# Patient Record
Sex: Female | Born: 1990 | Race: Black or African American | Hispanic: No | Marital: Single
Health system: Southern US, Community
[De-identification: ages and names within clinical notes are randomized; demographics above are authoritative.]

---

## 2020-11-19 ENCOUNTER — Emergency Department (HOSPITAL_COMMUNITY): Payer: BC Managed Care – PPO

## 2020-11-19 ENCOUNTER — Emergency Department (HOSPITAL_BASED_OUTPATIENT_CLINIC_OR_DEPARTMENT_OTHER): Payer: BC Managed Care – PPO

## 2020-11-19 ENCOUNTER — Other Ambulatory Visit: Payer: Self-pay

## 2020-11-19 ENCOUNTER — Emergency Department (HOSPITAL_COMMUNITY)
Admission: EM | Admit: 2020-11-19 | Discharge: 2020-11-19 | Disposition: A | Discharge status: Home / Self Care | Payer: BC Managed Care – PPO | Attending: Emergency Medicine | Admitting: Emergency Medicine

## 2020-11-19 DIAGNOSIS — Y990 Civilian activity done for income or pay: Secondary | ICD-10-CM | POA: Insufficient documentation

## 2020-11-19 DIAGNOSIS — M7989 Other specified soft tissue disorders: Secondary | ICD-10-CM

## 2020-11-19 DIAGNOSIS — S82841A Displaced bimalleolar fracture of right lower leg, initial encounter for closed fracture: Secondary | ICD-10-CM | POA: Diagnosis not present

## 2020-11-19 DIAGNOSIS — M79604 Pain in right leg: Secondary | ICD-10-CM

## 2020-11-19 DIAGNOSIS — S99911A Unspecified injury of right ankle, initial encounter: Secondary | ICD-10-CM | POA: Diagnosis present

## 2020-11-19 DIAGNOSIS — W1839XA Other fall on same level, initial encounter: Secondary | ICD-10-CM | POA: Diagnosis not present

## 2020-11-19 LAB — POC URINE PREG, ED: Preg Test, Ur: NEGATIVE

## 2020-11-19 MED ORDER — FENTANYL CITRATE (PF) 100 MCG/2ML IJ SOLN
50.0000 ug | Freq: Once | INTRAMUSCULAR | Status: AC
  Administered 2020-11-19: 50 ug via INTRAMUSCULAR
  Filled 2020-11-19: qty 2

## 2020-11-19 MED ORDER — OXYCODONE-ACETAMINOPHEN 5-325 MG PO TABS
1.0000 | ORAL_TABLET | Freq: Once | ORAL | Status: AC
  Administered 2020-11-19: 1 via ORAL
  Filled 2020-11-19: qty 1

## 2020-11-19 MED ORDER — OXYCODONE-ACETAMINOPHEN 5-325 MG PO TABS: 1.0000 | ORAL_TABLET | Freq: Four times a day (QID) | ORAL | 0 refills | Status: AC | PRN

## 2020-11-19 MED ORDER — IBUPROFEN 800 MG PO TABS: 800.0000 mg | ORAL_TABLET | Freq: Three times a day (TID) | ORAL | 0 refills | Status: AC
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
## 2020-11-19 NOTE — ED Notes (Signed)
Pt given a cup of water to drink per PA for urine pregnancy test.
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
## 2020-11-19 NOTE — ED Notes (Signed)
Pt transported to Vascular for ultrasound.  ?

## 2020-11-19 NOTE — Progress Notes (Signed)
Orthopedic Tech Progress Note Patient Details:  Becky Mckenzie [birth year] [id number]   Ortho Devices Type of Ortho Device: Stirrup splint, Post (short) splint, Crutches Splint Material: Plaster Ortho Device/Splint Location: RLE Ortho Device/Splint Interventions: Ordered, Application, Adjustment   Post Interventions Patient Tolerated: Fair Instructions Provided: Care of device, Poper ambulation with device  Docia Furl 11/19/2020, 5:06 PM

## 2020-11-19 NOTE — Discharge Instructions (Signed)
Please call Dr. Donnie Mesa office tomorrow regarding need for follow up of your ankle fracture  Do not get splint wet until you can see Dr. De Nurse.  You also cannot bare any weight onto the ankle.   Pick up medication and take as prescribed. I would recommend taking the Ibuprofen first and if you have break through pain to then take the percocet.   While at home please rest, ice, and elevate your leg to help reduce pain/swelling

## 2020-11-19 NOTE — Progress Notes (Signed)
RLE venous duplex has been completed.  Preliminary results given to [place], Charity fundraiser.  Results can be found under chart review under CV PROC. 11/19/2020 1:25 PM Jennae Hakeem RVT, RDMS

## 2022-08-25 IMAGING — DX DG ANKLE COMPLETE 3+V*R*
3 series · 3 of 3 positions shown · non-contrast
Comparison: None.

CLINICAL DATA: Right ankle pain

EXAM:
RIGHT ANKLE - COMPLETE 3+ VIEW

[ankle ap]
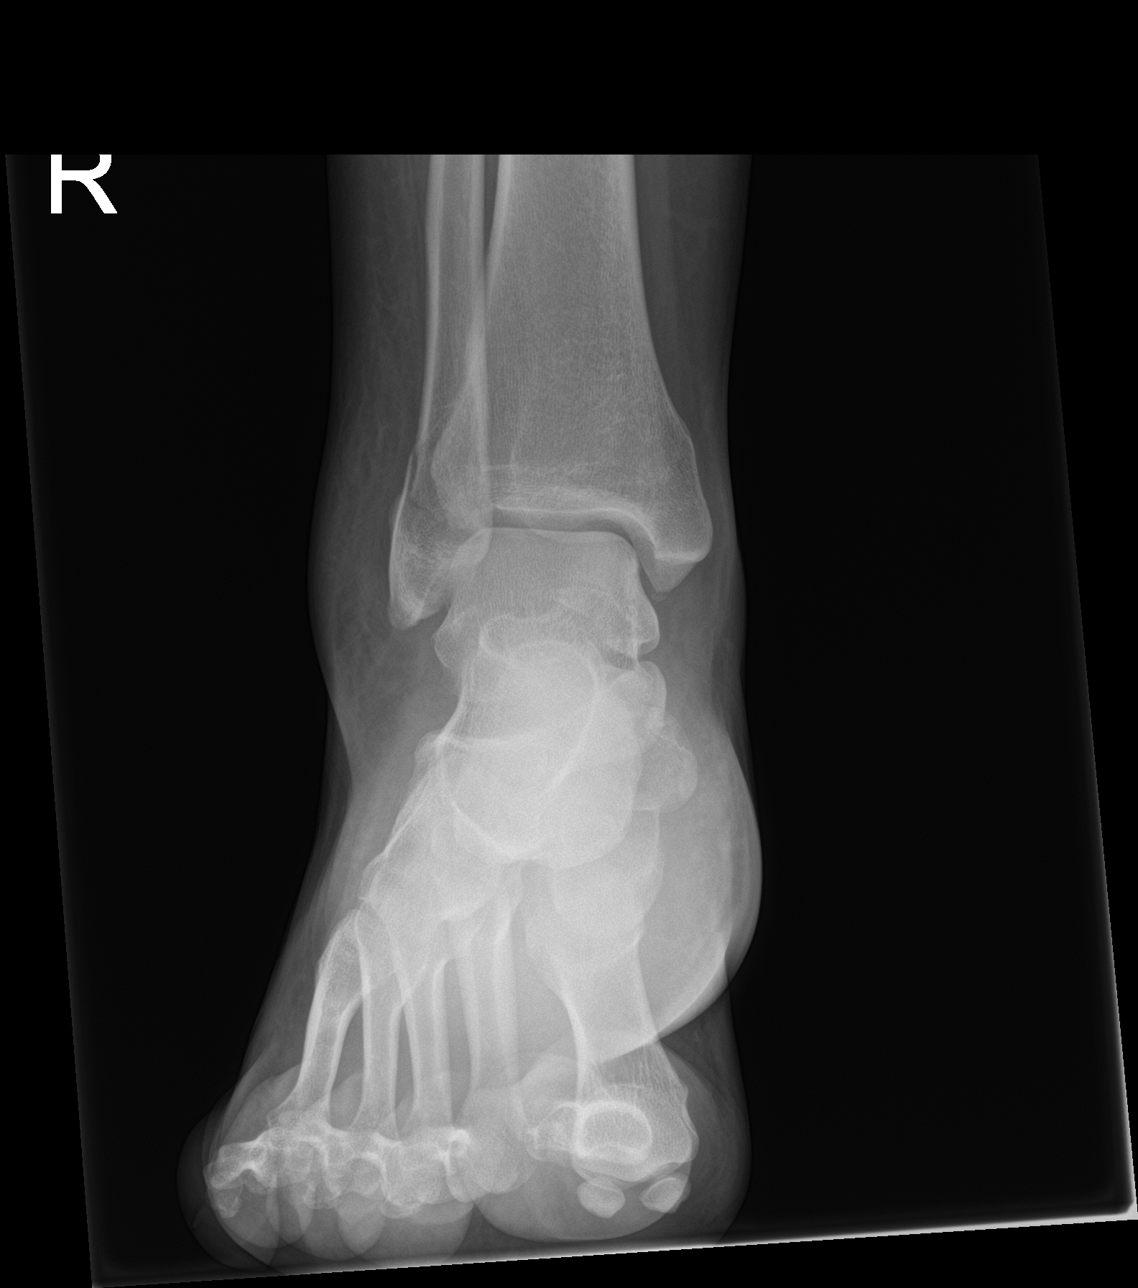

[ankle obl]
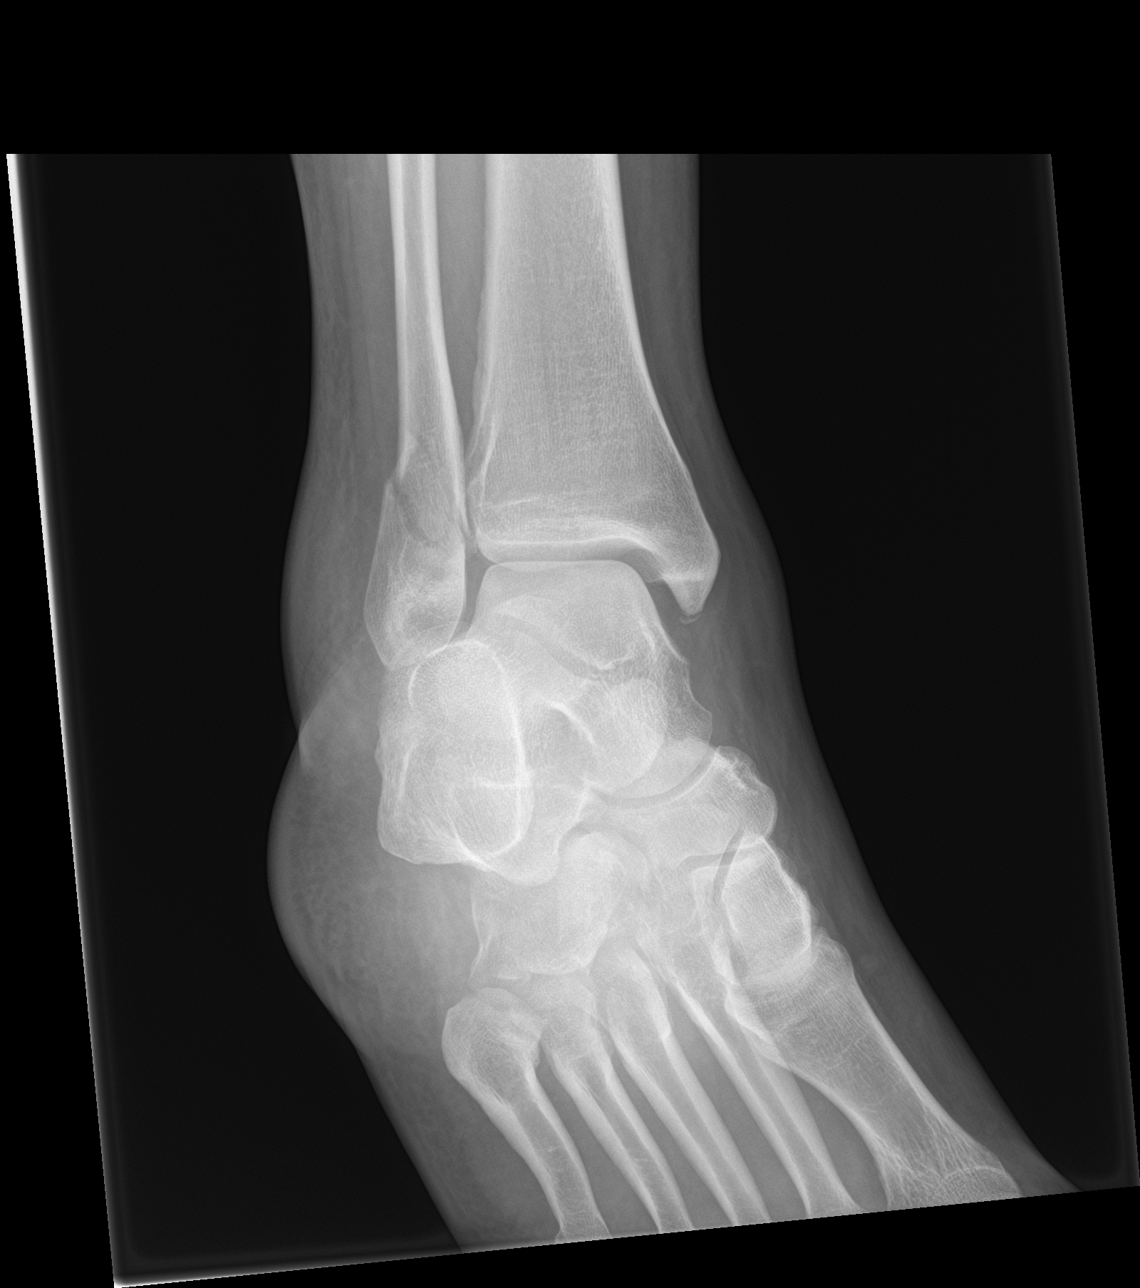

[ankle lat]
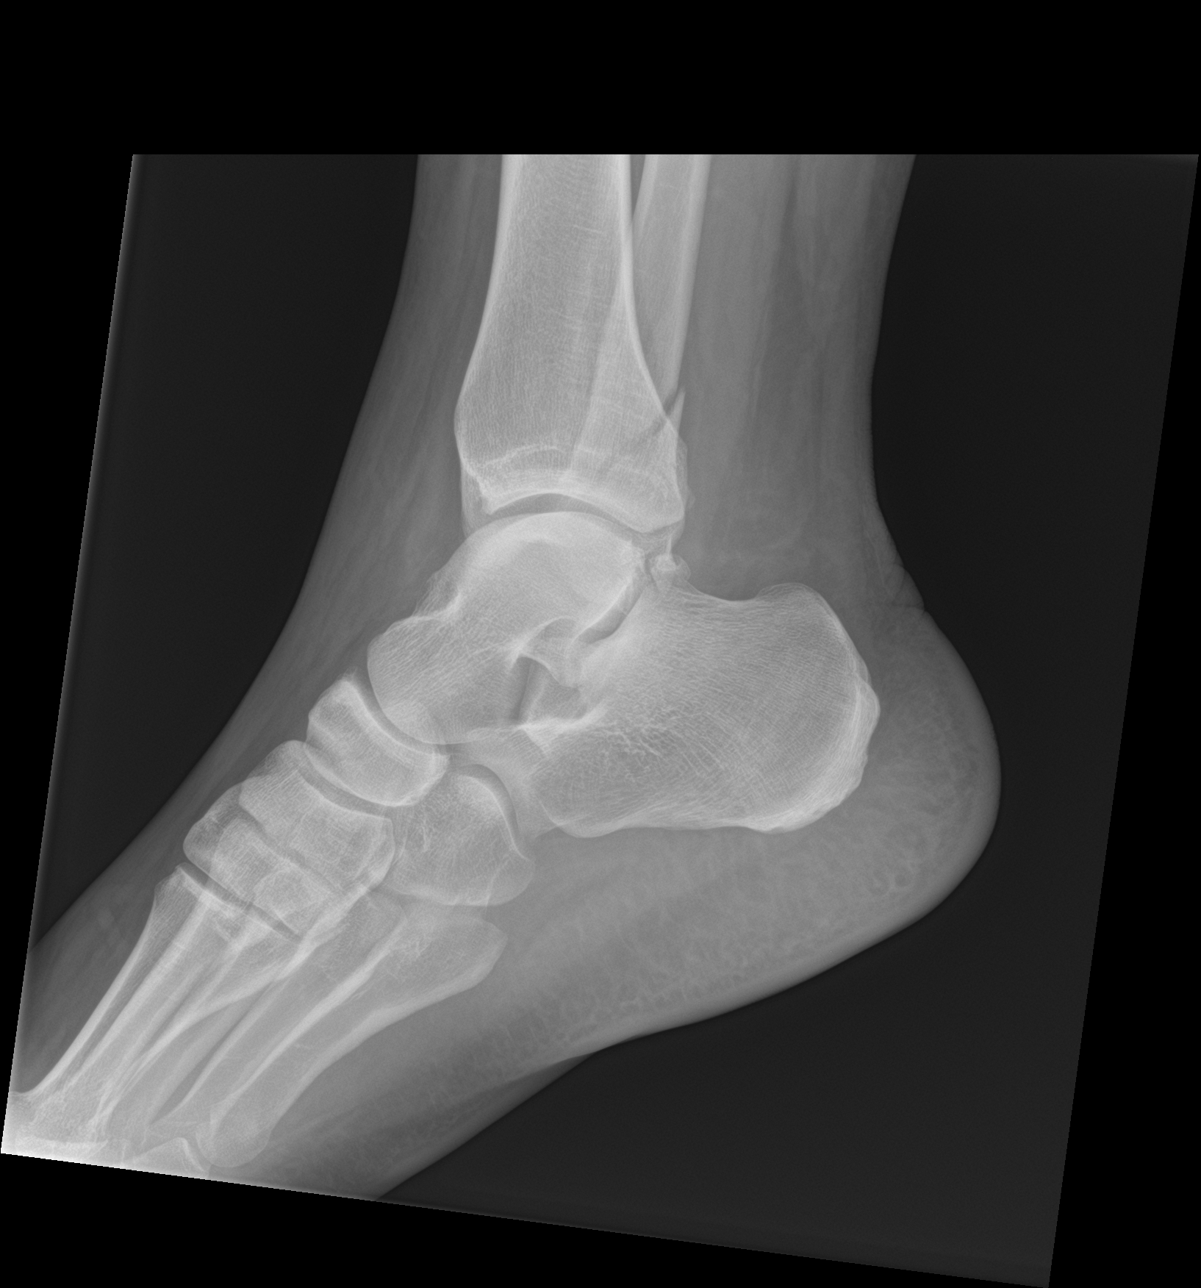

[3 of 3 positions shown; findings below may reference images not displayed]

FINDINGS: Acute minimally displaced fracture of the distal fibular metaphysis.
Tiny cortical avulsion fracture from the medial malleolar tip. No
definite posterior malleolar fracture. Borderline widening of the
medial clear space of the ankle mortise without dislocation. Diffuse
soft tissue swelling about the ankle.
IMPRESSION: 1. Acute minimally displaced fracture of the distal fibular
metaphysis.
2. Tiny cortical avulsion fracture from the medial malleolar tip.
3. Borderline widening of the medial clear space of the ankle
mortise without dislocation.
